# Patient Record
Sex: Female | Born: 1950 | Race: White | Hispanic: No | Marital: Married
Health system: Southern US, Community
[De-identification: ages and names within clinical notes are randomized; demographics above are authoritative.]

## PROBLEM LIST (undated history)

## (undated) DIAGNOSIS — Z8371 Family history of colonic polyps: Secondary | ICD-10-CM

## (undated) DIAGNOSIS — M1990 Unspecified osteoarthritis, unspecified site: Secondary | ICD-10-CM

## (undated) HISTORY — PX: ABDOMINAL HYSTERECTOMY: SHX81

## (undated) HISTORY — DX: Family history of colonic polyps: Z83.71

## (undated) HISTORY — DX: Unspecified osteoarthritis, unspecified site: M19.90

---

## 1988-11-22 HISTORY — PX: ANKLE SURGERY: SHX546

## 2005-04-22 ENCOUNTER — Emergency Department: Payer: Self-pay | Admitting: Emergency Medicine

## 2007-11-03 ENCOUNTER — Emergency Department: Payer: Self-pay | Admitting: Emergency Medicine

## 2008-04-29 ENCOUNTER — Emergency Department: Payer: Self-pay | Admitting: Emergency Medicine

## 2008-04-29 ENCOUNTER — Other Ambulatory Visit: Payer: Self-pay

## 2008-05-23 ENCOUNTER — Ambulatory Visit: Payer: Self-pay | Admitting: Podiatry

## 2011-03-30 ENCOUNTER — Ambulatory Visit: Payer: Self-pay | Admitting: Family Medicine

## 2011-04-23 ENCOUNTER — Emergency Department: Payer: Self-pay | Admitting: *Deleted

## 2011-04-29 ENCOUNTER — Ambulatory Visit: Payer: Self-pay | Admitting: Urology

## 2011-05-07 ENCOUNTER — Ambulatory Visit: Payer: Self-pay | Admitting: Urology

## 2011-05-18 ENCOUNTER — Ambulatory Visit: Payer: Self-pay | Admitting: General Surgery

## 2011-05-19 LAB — PATHOLOGY REPORT

## 2013-03-04 IMAGING — US ULTRASOUND RIGHT BREAST
1 series · 6 of 6 positions shown · non-contrast
Comparison: none

REASON FOR EXAM: RT AUX LUMP STRONH FML HX
COMMENTS:

PROCEDURE:     US  - US BREAST RIGHT  - March 30, 2011 [DATE]
RESULT:     Ultrasound reveals no significant abnormality in the region of
right axillary mass. A negative mammogram and ultrasound does not preclude
further evaluation including biopsy if a palpable lesion is present.

[Series 1: ultrasound right breast · 6 of 6 slices shown]
[im 1/6]
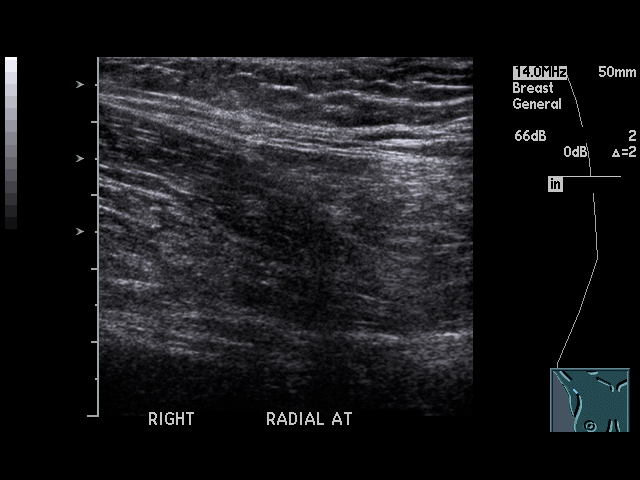
[im 2/6]
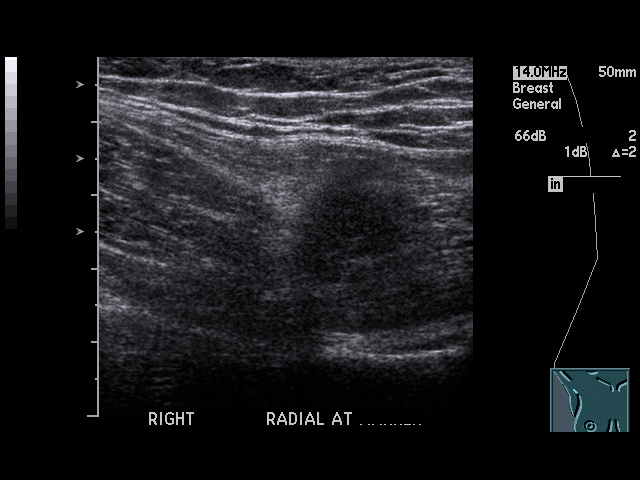
[im 3/6]
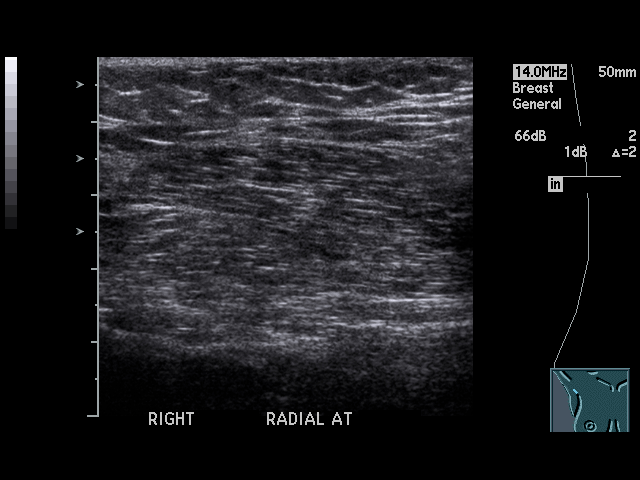
[im 4/6]
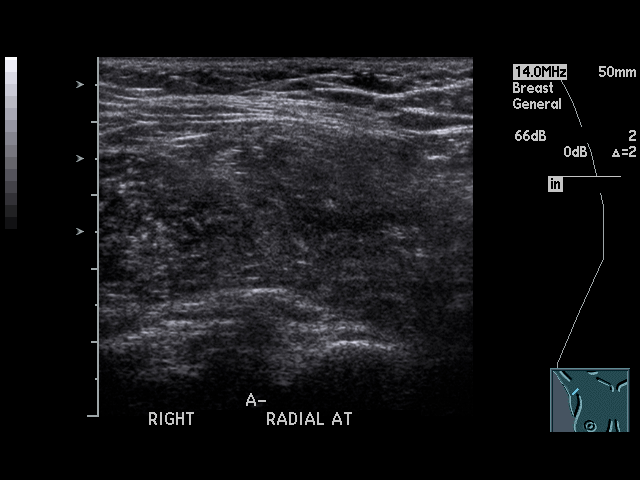
[im 5/6]
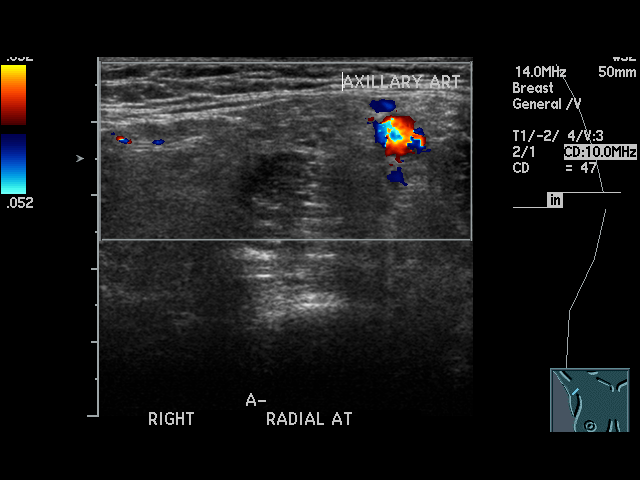
[im 6/6]
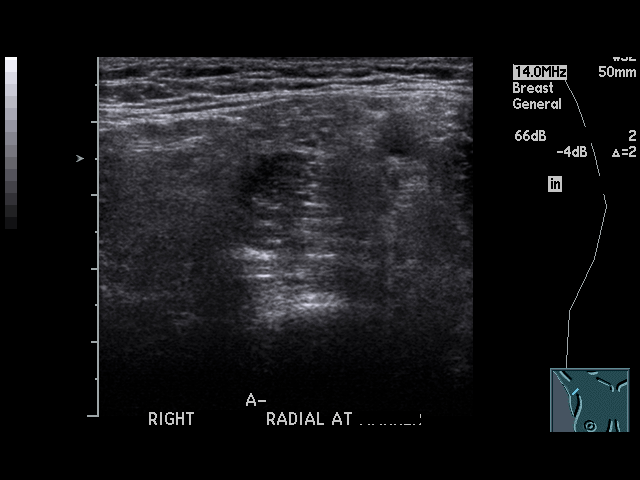

[6 of 6 positions shown; findings below may reference images not displayed]

IMPRESSION: BI-RADS: Category 1 - Negative

## 2013-03-28 IMAGING — CT CT STONE STUDY
1 of 2 series · 15 of 32 positions shown, 19 images · non-contrast
Comparison: none

REASON FOR EXAM: left flank pain
COMMENTS:   May transport without cardiac monitor

[Series 2: stone · axial · 0.70mm/px · z∈[-1030,-619]mm · 15 of 149 slices shown, 19 images]
[im 6/149  soft-tissue]
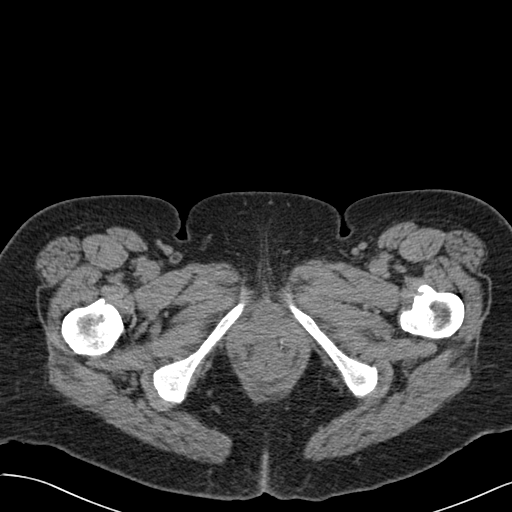
[im 6/149  bone]
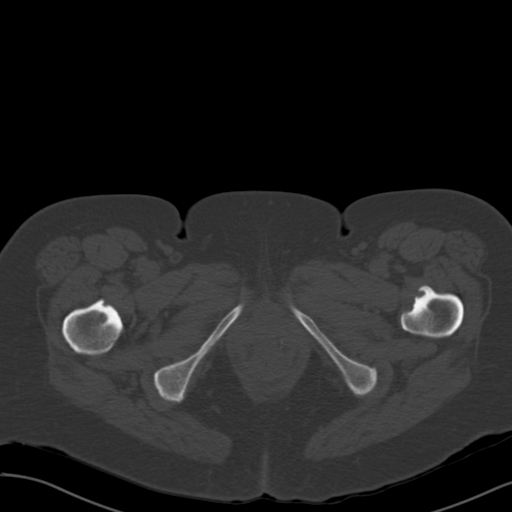
[im 18/149  soft-tissue]
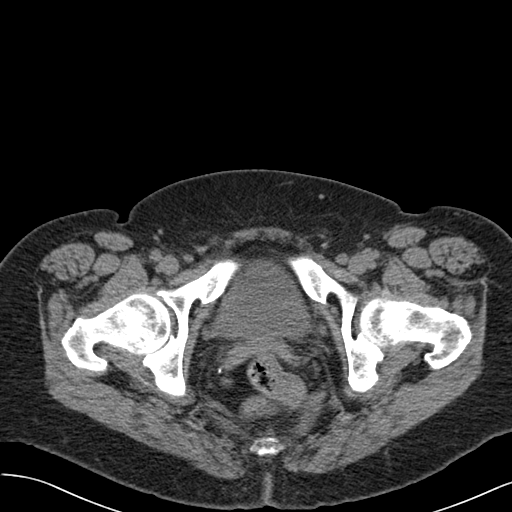
[im 29/149  soft-tissue]
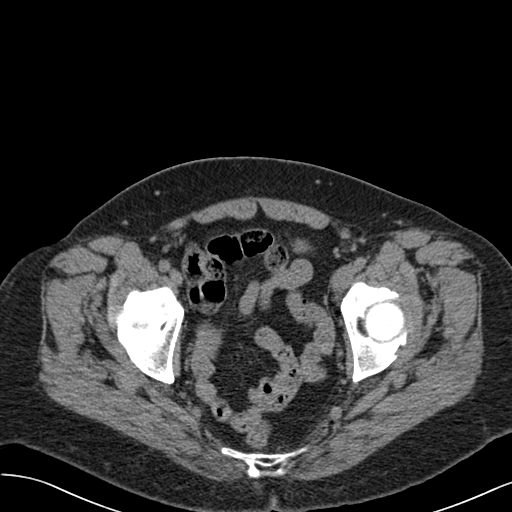
[im 40/149  soft-tissue]
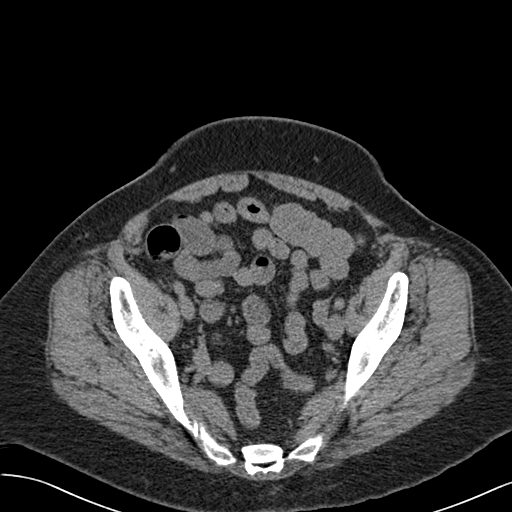
[im 52/149  soft-tissue]
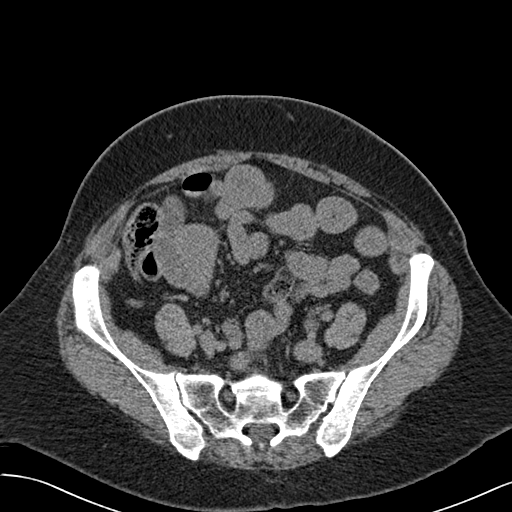
[im 63/149  soft-tissue]
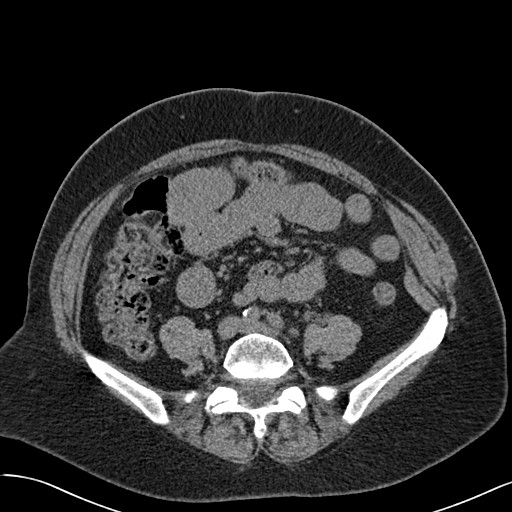
[im 75/149  soft-tissue]
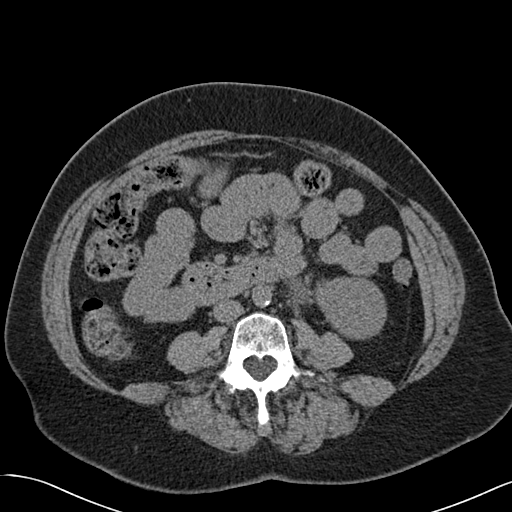
[im 86/149  soft-tissue]
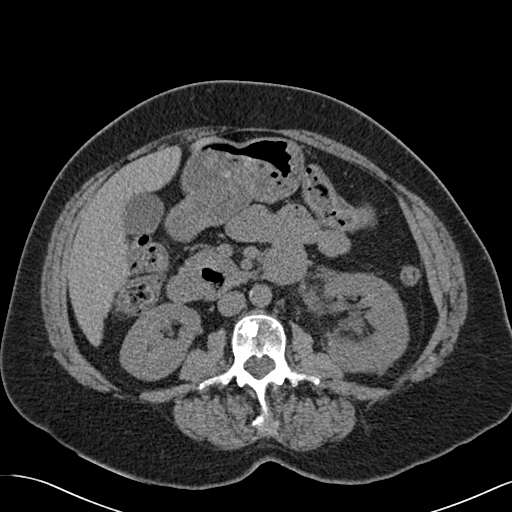
[im 97/149  soft-tissue]
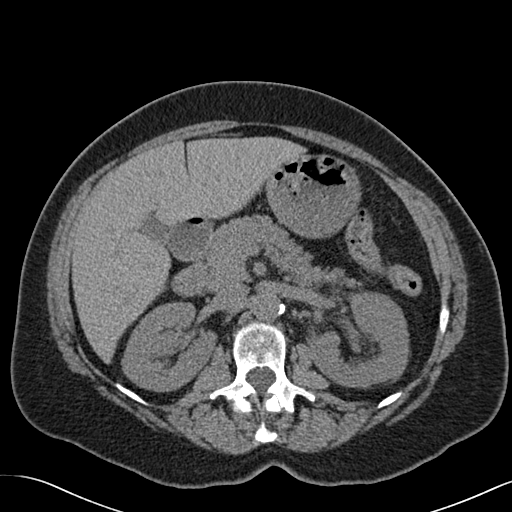
[im 97/149  bone]
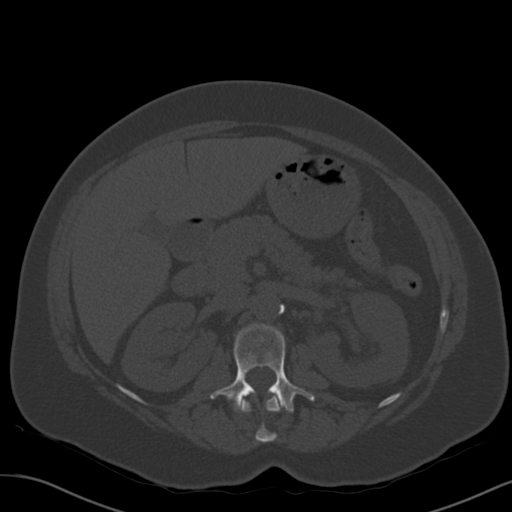
[im 109/149  soft-tissue]
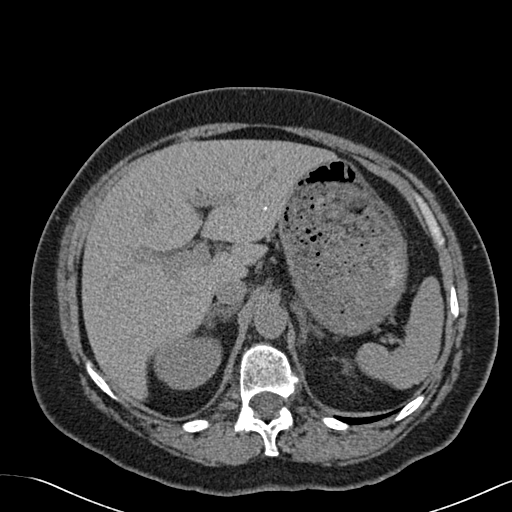
[im 120/149  soft-tissue]
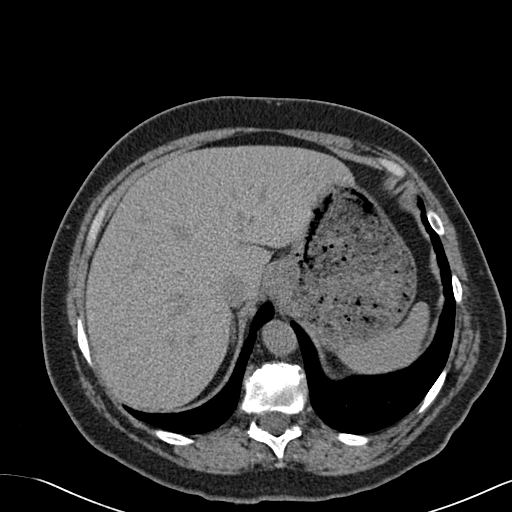
[im 126/149  lung]
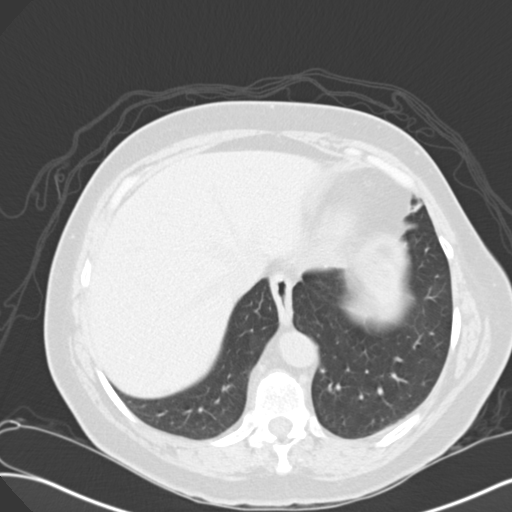
[im 131/149  soft-tissue]
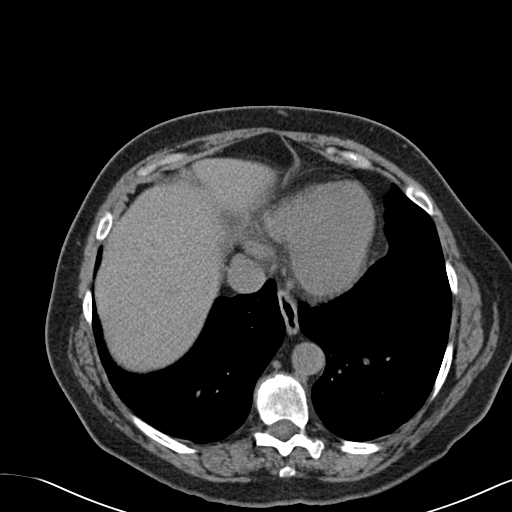
[im 131/149  lung]
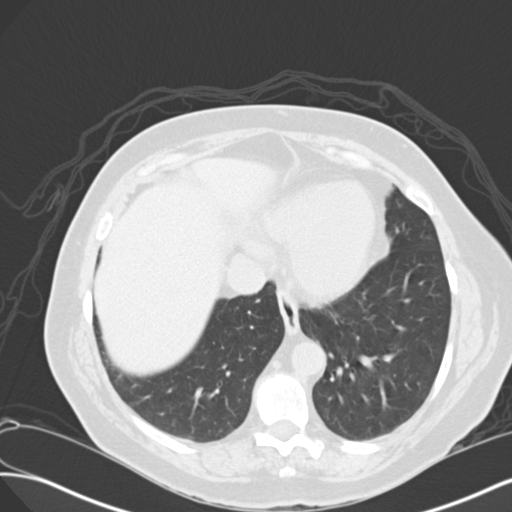
[im 137/149  lung]
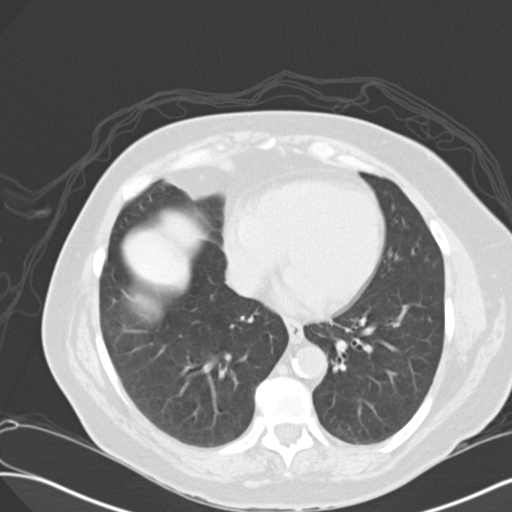
[im 143/149  soft-tissue]
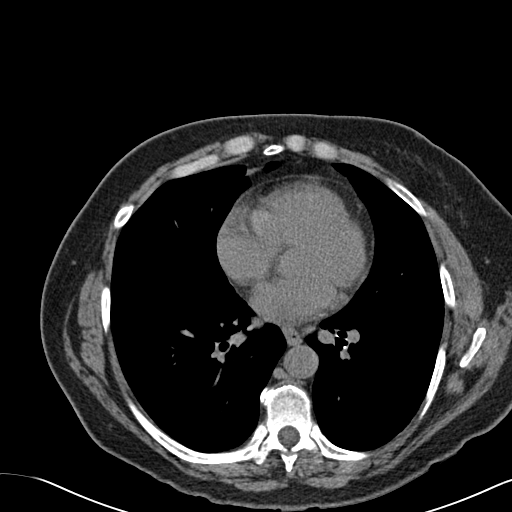
[im 143/149  lung]
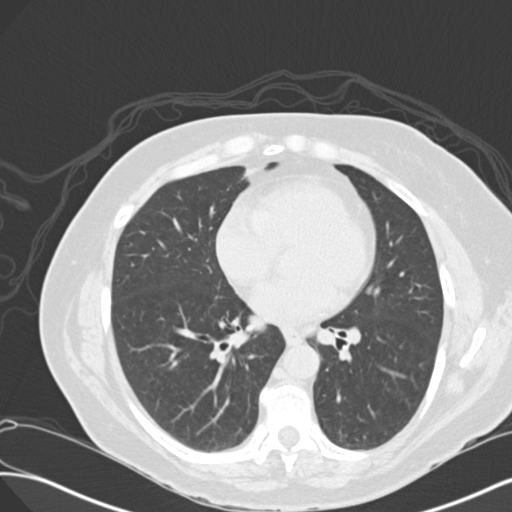

[15 of 32 positions shown; findings below may reference images not displayed]

PROCEDURE:     CT  - CT ABDOMEN /PELVIS WO (STONE)  - April 23, 2011  [DATE]

RESULT:     Axial noncontrast CT scanning was performed through the abdomen
and pelvis at 3 mm intervals and slice thicknesses. Review of multiplanar
reconstructed images was performed separately on the [employer] monitor.

The left kidney is larger than the right. There is mild hydronephrosis on
the left. There is hydroureter as well. This appears to be related to a 2
millimeter diameter stone at the left ureteropelvic junction. There is
mildly increased density in the perinephric fat.

On the right the kidney exhibits no calcified stones. There are likely
parapelvic cysts present. There is no evidence of obstruction. The partially
distended urinary bladder is grossly normal. The uterus is surgically
absent. I see no adnexal masses.

The stomach is moderately distended with food. The liver, gallbladder,
pancreas, spleen, and right adrenal gland are normal in appearance. There is
mild enlargement of the left adrenal gland with an approximately 1.8 x
cm diameter mass in the body of the adrenal gland. The caliber of the
abdominal aorta is normal. The unopacified loops of small and large bowel
are normal in appearance. The lung bases are clear. The lumbar vertebral
bodies are preserved in height.
IMPRESSION: 1. There is mild hydronephrosis and hydroureter on the left secondary to a 2
mm diameter left ureteropelvic junction stone. I see no stones elsewhere on
the left. There is no evidence of obstruction of the right kidney or ureter.
2. I see no acute bowel abnormality nor acute hepatobiliary abnormality.
3. There is mild enlargement of the left adrenal gland.

## 2013-06-07 ENCOUNTER — Encounter: Payer: Self-pay | Admitting: *Deleted

## 2016-02-12 ENCOUNTER — Encounter: Payer: Self-pay | Admitting: *Deleted

## 2016-04-26 ENCOUNTER — Encounter: Payer: Self-pay | Admitting: *Deleted

## 2016-04-29 ENCOUNTER — Encounter: Payer: Self-pay | Admitting: General Surgery

## 2016-05-03 ENCOUNTER — Ambulatory Visit: Payer: Self-pay | Admitting: General Surgery

## 2016-07-06 ENCOUNTER — Encounter: Payer: Self-pay | Admitting: *Deleted

## 2016-08-03 ENCOUNTER — Telehealth: Payer: Self-pay | Admitting: *Deleted

## 2016-08-03 NOTE — Telephone Encounter (Signed)
Left numerous messages to confirm address to mail appointment reminder-left message for appointments for follow up mammogram and office visit scheduled for 09/14/16 at 3:15pm.

## 2016-09-08 ENCOUNTER — Encounter: Payer: Self-pay | Admitting: *Deleted

## 2016-09-14 ENCOUNTER — Ambulatory Visit: Payer: Self-pay | Admitting: General Surgery
